# Patient Record
Sex: Male | Born: 1955 | Race: White | Hispanic: No | Marital: Married | Smoking: Never smoker
Health system: Southern US, Community
[De-identification: ages and names within clinical notes are randomized; demographics above are authoritative.]

## PROBLEM LIST (undated history)

## (undated) DIAGNOSIS — N200 Calculus of kidney: Secondary | ICD-10-CM

## (undated) HISTORY — DX: Calculus of kidney: N20.0

## (undated) HISTORY — PX: NO PAST SURGERIES: SHX2092

---

## 2018-12-19 ENCOUNTER — Other Ambulatory Visit: Payer: Self-pay

## 2018-12-19 ENCOUNTER — Ambulatory Visit (INDEPENDENT_AMBULATORY_CARE_PROVIDER_SITE_OTHER): Payer: Federal, State, Local not specified - PPO | Admitting: Sports Medicine

## 2018-12-19 ENCOUNTER — Ambulatory Visit (INDEPENDENT_AMBULATORY_CARE_PROVIDER_SITE_OTHER): Payer: Federal, State, Local not specified - PPO

## 2018-12-19 ENCOUNTER — Encounter: Payer: Self-pay | Admitting: Sports Medicine

## 2018-12-19 DIAGNOSIS — M1712 Unilateral primary osteoarthritis, left knee: Secondary | ICD-10-CM

## 2018-12-19 MED ORDER — ACETAMINOPHEN ER 650 MG PO TBCR: 650.0000 mg | EXTENDED_RELEASE_TABLET | Freq: Three times a day (TID) | ORAL | 3 refills | Status: AC | PRN

## 2018-12-19 NOTE — Assessment & Plan Note (Signed)
X-rays, occasional arthritis from Tylenol, he will get a neoprene knee sleeve. Ice 20 minutes 3-4 times a day. Symptoms are overall very mild. Rehab exercises given. He can return to see me as needed.

## 2018-12-19 NOTE — Progress Notes (Signed)
Subjective:    CC: Left knee pain  HPI:  This is a pleasant 63 year old male, for some time now he is had pain in his left knee, medial aspect, gelling, no constitutional symptoms, no trauma, no mechanical symptoms, it went away yesterday.  I reviewed the past medical history, family history, social history, surgical history, and allergies today and no changes were needed.  Please see the problem list section below in epic for further details.  Past Medical History: Past Medical History:  Diagnosis Date  . Kidney stone    Past Surgical History: Past Surgical History:  Procedure Laterality Date  . NO PAST SURGERIES     Social History: Social History   Socioeconomic History  . Marital status: Married    Spouse name: Not on file  . Number of children: Not on file  . Years of education: Not on file  . Highest education level: Not on file  Occupational History  . Not on file  Social Needs  . Financial resource strain: Not on file  . Food insecurity    Worry: Not on file    Inability: Not on file  . Transportation needs    Medical: Not on file    Non-medical: Not on file  Tobacco Use  . Smoking status: Never Smoker  . Smokeless tobacco: Never Used  Substance and Sexual Activity  . Alcohol use: Not Currently  . Drug use: Never  . Sexual activity: Not on file  Lifestyle  . Physical activity    Days per week: Not on file    Minutes per session: Not on file  . Stress: Not on file  Relationships  . Social Herbalist on phone: Not on file    Gets together: Not on file    Attends religious service: Not on file    Active member of club or organization: Not on file    Attends meetings of clubs or organizations: Not on file    Relationship status: Not on file  Other Topics Concern  . Not on file  Social History Narrative  . Not on file   Family History: No family history on file. Allergies: No Known Allergies Medications: See med rec.  Review of  Systems: No headache, visual changes, nausea, vomiting, diarrhea, constipation, dizziness, abdominal pain, skin rash, fevers, chills, night sweats, swollen lymph nodes, weight loss, chest pain, body aches, joint swelling, muscle aches, shortness of breath, mood changes, visual or auditory hallucinations.  Objective:    General: Well Developed, well nourished, and in no acute distress.  Neuro: Alert and oriented x3, extra-ocular muscles intact, sensation grossly intact.  HEENT: Normocephalic, atraumatic, pupils equal round reactive to light, neck supple, no masses, no lymphadenopathy, thyroid nonpalpable.  Skin: Warm and dry, no rashes noted.  Cardiac: Regular rate and rhythm, no murmurs rubs or gallops.  Respiratory: Clear to auscultation bilaterally. Not using accessory muscles, speaking in full sentences.  Abdominal: Soft, nontender, nondistended, positive bowel sounds, no masses, no organomegaly.  Left knee: Normal to inspection with no erythema or effusion or obvious bony abnormalities. Palpation normal with no warmth or joint line tenderness or patellar tenderness or condyle tenderness. ROM normal in flexion and extension and lower leg rotation. Ligaments with solid consistent endpoints including ACL, PCL, LCL, MCL. Negative Mcmurray's and provocative meniscal tests. Non painful patellar compression. Patellar and quadriceps tendons unremarkable. Hamstring and quadriceps strength is normal.  Impression and Recommendations:    The patient was counselled, risk factors were  discussed, anticipatory guidance given.  Primary osteoarthritis of left knee X-rays, occasional arthritis from Tylenol, he will get a neoprene knee sleeve. Ice 20 minutes 3-4 times a day. Symptoms are overall very mild. Rehab exercises given. He can return to see me as needed.   ___________________________________________ Ihor Austinhomas J. Benjamin Stainhekkekandam, M.D., ABFM., CAQSM. Primary Care and Sports Medicine [place]  [facility]  Adjunct Professor of Family Medicine  [employer]

## 2020-11-09 IMAGING — DX DG KNEE 1-2V*R*
4 series · 4 of 4 positions shown · non-contrast
Comparison: Right knee for comparison.

CLINICAL DATA: Medial left knee pain.

EXAM:
RIGHT KNEE - 1-2 VIEW; LEFT KNEE - COMPLETE 4+ VIEW

[tunnel]
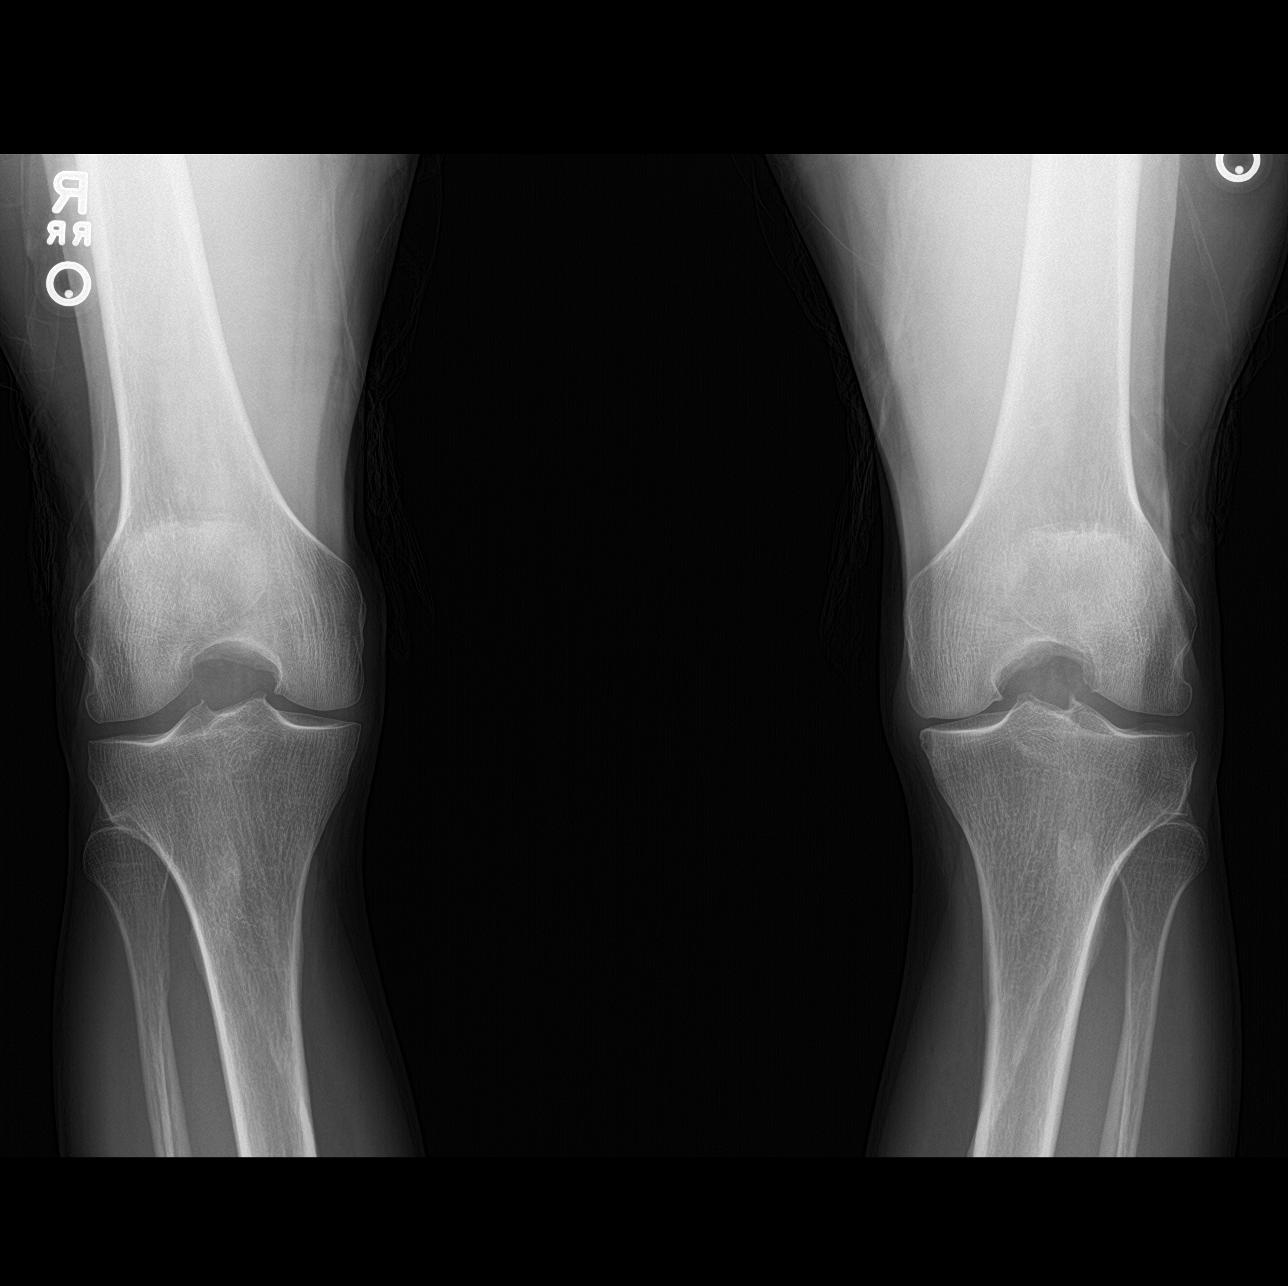

[knee lat]
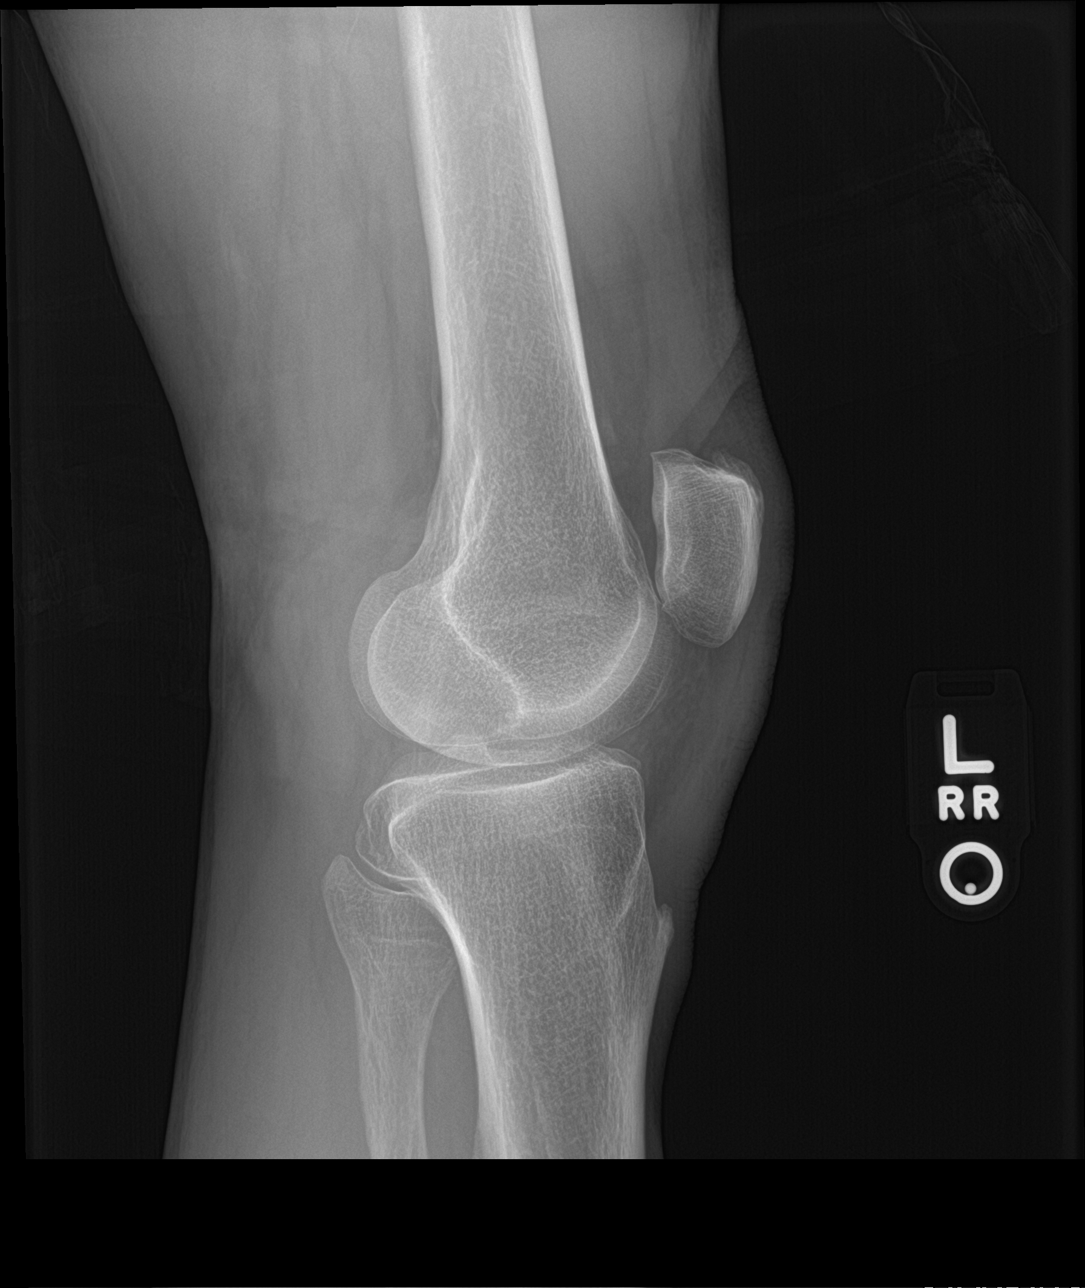

[knee sunrise]
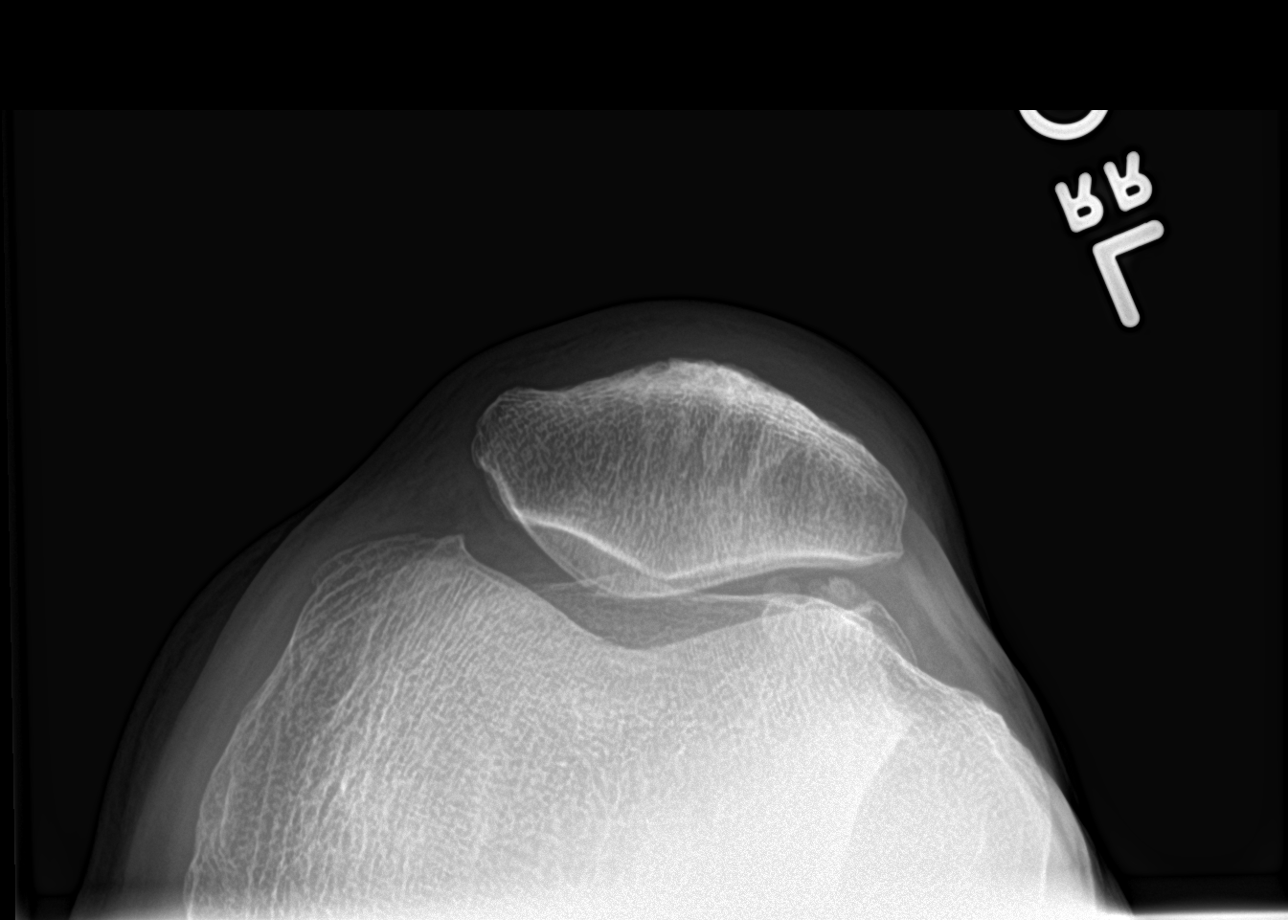

[knee ap bilat standing]
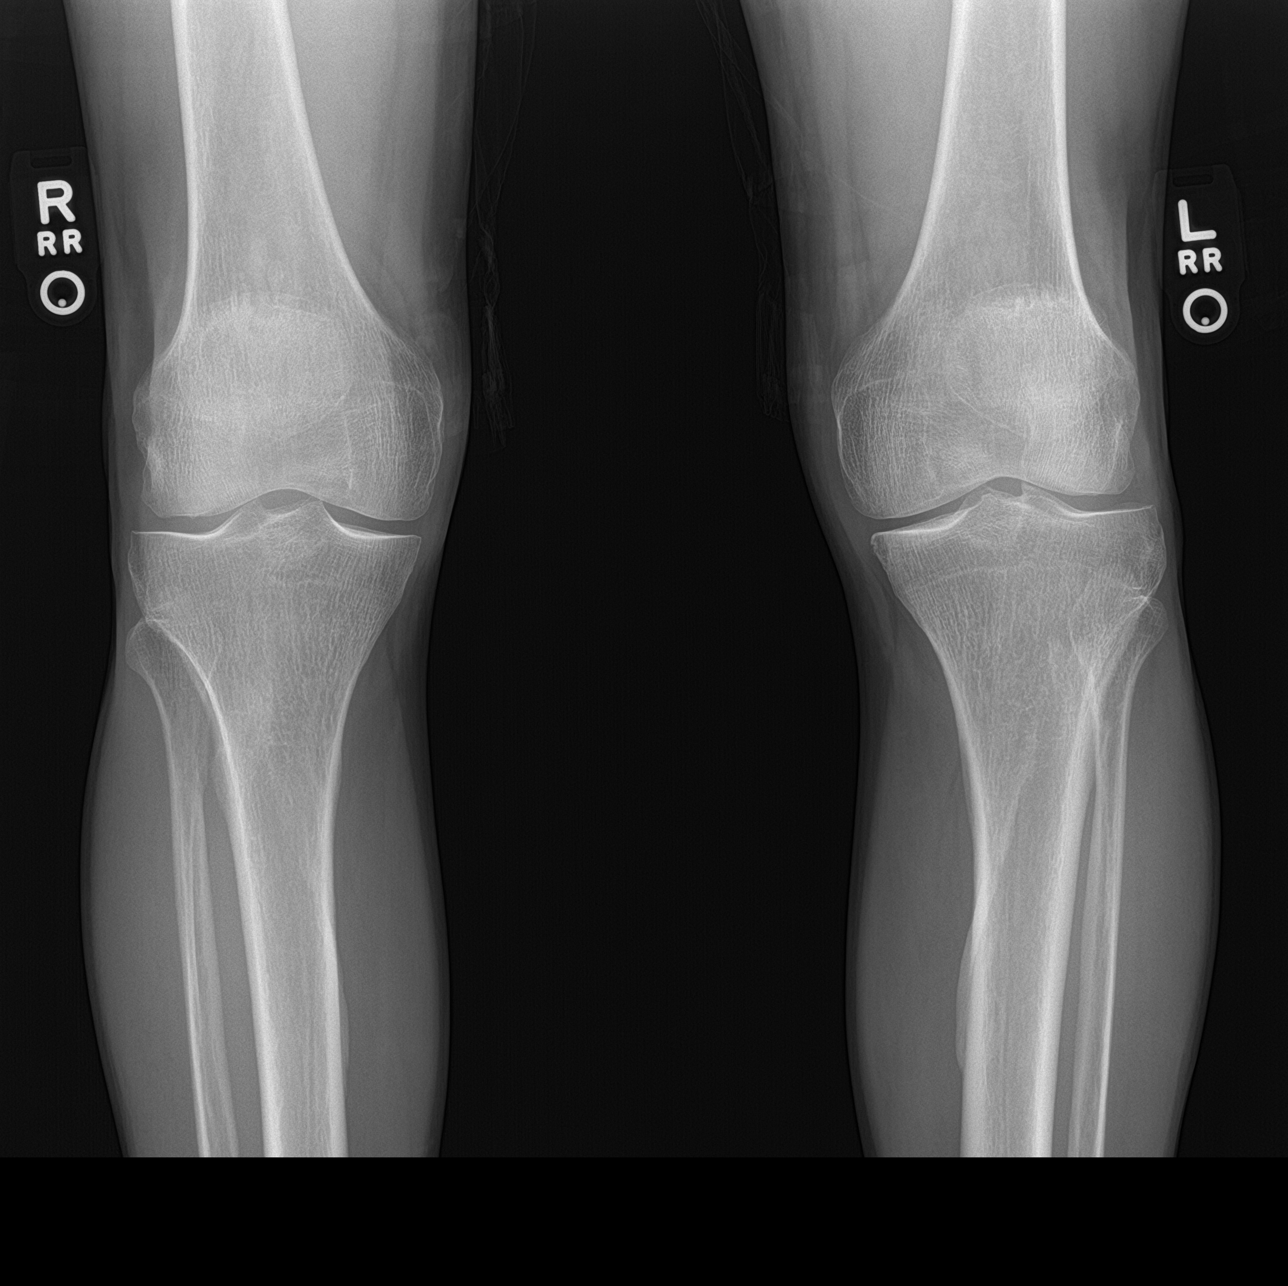

[4 of 4 positions shown; findings below may reference images not displayed]

FINDINGS: Left knee: There is slight medial joint space narrowing. No joint
effusion. Tiny marginal osteophytes on the patella.

Right knee: Normal.
IMPRESSION: Slight arthritic changes of the medial and patellofemoral
compartments of the left knee.

Normal right knee.

## 2022-10-20 ENCOUNTER — Ambulatory Visit
Admission: EM | Admit: 2022-10-20 | Discharge: 2022-10-20 | Disposition: A | Discharge status: Home / Self Care | Payer: Federal, State, Local not specified - PPO | Attending: Family Medicine | Admitting: Family Medicine

## 2022-10-20 DIAGNOSIS — T700XXA Otitic barotrauma, initial encounter: Secondary | ICD-10-CM

## 2022-10-20 MED ORDER — NEOMYCIN-POLYMYXIN-HC 3.5-10000-1 OT SUSP: 3.0000 [drp] | Freq: Three times a day (TID) | OTIC | 0 refills | Status: AC

## 2022-10-20 NOTE — ED Provider Notes (Signed)
[facility]    CSN: [id number] Arrival date & time: 10/20/22  1053      History   Chief Complaint Chief Complaint  Patient presents with   Otalgia    HPI Aaron Hart is a 67 y.o. male.   HPI  [employer] at a [employer].  Jumped off cliffs into deep water.  Has pain at the time in both ears and pain and ringing persists today No cold allergy or sinus symptoms Hearing normal  Past Medical History:  Diagnosis Date   Kidney stone     Patient Active Problem List   Diagnosis Date Noted   Primary osteoarthritis of left knee 12/19/2018    Past Surgical History:  Procedure Laterality Date   NO PAST SURGERIES         Home Medications    Prior to Admission medications   Medication Sig Start Date End Date Taking? Authorizing Provider  neomycin-polymyxin-hydrocortisone (CORTISPORIN) 3.5-10000-1 OTIC suspension Place 3 drops into both ears 3 (three) times daily. 10/20/22  Yes Eustace Moore, MD  acetaminophen (TYLENOL) 650 MG CR tablet Take 1 tablet (650 mg total) by mouth every 8 (eight) hours as needed for pain. 12/19/18   Monica Becton, MD    Family History History reviewed. No pertinent family history.  Social History Social History   Tobacco Use   Smoking status: Never   Smokeless tobacco: Never  Substance Use Topics   Alcohol use: Not Currently   Drug use: Never     Allergies   Patient has no known allergies.   Review of Systems Review of Systems See HPI  Physical Exam Triage Vital Signs ED Triage Vitals [10/20/22 1110]  Enc Vitals Group     BP (!) 149/76     Pulse Rate (!) 55     Resp 18     Temp 97.6 F (36.4 C)     Temp Source Oral     SpO2 98 %     Weight      Height      Head Circumference      Peak Flow      Pain Score 1     Pain Loc      Pain Edu?      Excl. in GC?    No data found.  Updated Vital Signs BP (!) 149/76 (BP Location: Left Arm)   Pulse (!) 55   Temp 97.6 F (36.4 C) (Oral)   Resp  18   SpO2 98%      Physical Exam Constitutional:      General: He is not in acute distress.    Appearance: He is well-developed.  HENT:     Head: Normocephalic and atraumatic.     Right Ear: External ear normal.     Left Ear: External ear normal.     Ears:     Comments: Both Tms with injection    Nose: No congestion.  Eyes:     Conjunctiva/sclera: Conjunctivae normal.     Pupils: Pupils are equal, round, and reactive to light.  Cardiovascular:     Rate and Rhythm: Normal rate.  Pulmonary:     Effort: Pulmonary effort is normal. No respiratory distress.  Abdominal:     General: There is no distension.     Palpations: Abdomen is soft.  Musculoskeletal:        General: Normal range of motion.     Cervical back: Normal range of motion.  Skin:  General: Skin is warm and dry.  Neurological:     Mental Status: He is alert.      UC Treatments / Results  Labs (all labs ordered are listed, but only abnormal results are displayed) Labs Reviewed - No data to display  EKG   Radiology No results found.  Procedures Procedures (including critical care time)  Medications Ordered in UC Medications - No data to display  Initial Impression / Assessment and Plan / UC Course  I have reviewed the triage vital signs and the nursing notes.  Pertinent labs & imaging results that were available during my care of the patient were reviewed by me and considered in my medical decision making (see chart for details).     Final Clinical Impressions(s) / UC Diagnoses   Final diagnoses:  Otitic barotrauma, initial encounter     Discharge Instructions      May take tylenol or ibuprofen for pain Use ear drops until the symptoms improve   ED Prescriptions     Medication Sig Dispense Auth. Provider   neomycin-polymyxin-hydrocortisone (CORTISPORIN) 3.5-10000-1 OTIC suspension Place 3 drops into both ears 3 (three) times daily. 10 mL Eustace Moore, MD      PDMP not  reviewed this encounter.   Eustace Moore, MD 10/20/22 1126

## 2022-10-20 NOTE — ED Triage Notes (Signed)
Patient c/o bilateral ear discomfort and tinnitus that began yesterday after swimming.  Home interventions: OTC swimmers ear ear drops

## 2022-10-20 NOTE — Discharge Instructions (Signed)
May take tylenol or ibuprofen for pain Use ear drops until the symptoms improve
